# Patient Record
Sex: Female | Born: 1950 | Race: White | Hispanic: No
Health system: Southern US, Community
[De-identification: ages and names within clinical notes are randomized; demographics above are authoritative.]

## PROBLEM LIST (undated history)

## (undated) ENCOUNTER — Emergency Department: Discharge status: Absent without leave | Payer: Disability Insurance

---

## 2014-12-16 ENCOUNTER — Inpatient Hospital Stay: Payer: Self-pay | Admitting: Internal Medicine

## 2015-03-02 NOTE — Discharge Summary (Signed)
PATIENT NAMMauro Walters:  Andrea Walters, Andrea Walters MR#:  [id number] DATE OF BIRTH:  1951-03-07  DATE OF ADMISSION:  12/16/2014 DATE OF DISCHARGE:  12/19/2014  ADMITTING DIAGNOSES:  1.  Chronic obstructive pulmonary disease exacerbation.  2.  Diabetes mellitus.  3.  Hypertension.  4.  Anxiety.  5.  Hyperlipidemia.   DISCHARGE DIAGNOSES:  1.  Acute exacerbation of chronic obstructive pulmonary disease.  2.  Insulin-requiring diabetes mellitus.  3.  Hypertension.  4.  Anxiety.  5.  Hyperlipidemia.  6.  Nicotine abuse.   PROCEDURES: None.   CONSULTATIONS: None.  BRIEF HISTORY AND PHYSICAL AND HOSPITAL COURSE BASED ON THE PROBLEM: 1.  Acute exacerbation of COPD. The patient came into the ED with a chief complaint of shortness of breath and cough. Please review history and physical for details. The patient failed outpatient antibiotic therapy with Z-Pak. In the ED, the patient was started on IV steroids and around-the-clock nebulizer treatments. She was also started on Levaquin IV. She was given oxygen via nasal cannula, as she was short of breath initially. With IV steroids and antibiotics, her clinical situation significantly improved. Nebulizer treatments were continued and today the patient is feeling better, off the oxygen, saturating at 95% on room air without a need for oxygen in the future. The patient will be discharged home with p.o. ciprofloxacin for 3 more days and a tapering dose of steroids. The patient will be continued on Advair and albuterol. 2.  Diabetes mellitus, insulin-requiring. The patient was taking Novolin 70/30 at home, which was continued during the hospital course. As the patient was on IV steroids, the patient was hyperglycemic and Novolin 70/30 dose was increased to 30 units subcutaneously twice a day. The plan is to continue metformin. The patient was seen by inpatient glycemic nurse and they have recommended the patient to follow up with them as an outpatient.  3.  Nicotine dependence.  The patient was counseled to quit smoking and the patient is willing to continue nicotine patch as an outpatient.  4.  Hypertension. Blood pressure being stable, the plan is to continue her home medication: atenolol and lisinopril.  5.  Anxiety. Continue Paxil.   Overall condition is stable. Decision is made to discharge the patient home in stable condition, and she does not need any oxygen as her pulse oximetry is fine.   DISCHARGE PHYSICAL EXAMINATION: VITAL SIGNS: Today, temperature 97.5, pulse 83, respirations 18, blood pressure 125/72, pulse oximetry 96% on room air.  GENERAL APPEARANCE: Not in acute distress. Moderately built and nourished.  HEENT: Normocephalic, atraumatic. Pupils are equally reacting to light and accommodation. No scleral icterus. No conjunctival injection. No sinus tenderness. No postnasal drip. Moist mucous membranes.  NECK: Supple. No JVD. No thyromegaly. Range of motion is intact.  LUNGS: Clear to auscultation bilaterally. No accessory muscle use and no anterior chest wall tenderness on palpation.  CARDIAC: S1, S2 normal. Regular rate and rhythm. No murmur.  GASTROINTESTINAL: Soft. Bowel sounds are positive in all 4 quadrants. Nontender, nondistended. No hepatosplenomegaly. No masses.  NEUROLOGIC: Awake, alert, oriented x 3. Cranial nerves II through XII grossly intact. Motor and sensory are intact. Reflexes are 2+.  EXTREMITIES: No edema. No cyanosis. No clubbing.  SKIN: Warm to touch. Normal turgor. No rashes. No lesions.  MUSCULOSKELETAL: No joint effusion, tenderness, edema. PSYCHIATRIC: Normal mood and affect.  LABORATORY AND IMAGING STUDIES: Sputum culture: Colonies are too small to read. Blood cultures: No growth in the 48 hours. On February 15, troponin 0.05. CBC is normal.  BMP: Glucose 105, BUN 12, creatinine 0.57, anion gap is 6. The rest of the BMP is normal. EKG has revealed sinus tachycardia on February 15, but eventually her heart rate is well  controlled.   CODE STATUS: The patient is full code.   ACTIVITY: As tolerated.   DIET: Low-salt, carbohydrate-controlled diet.  MEDICATIONS AT THE TIME OF DISCHARGE: Flonase nasal, 1 spray nasally once daily; ProAir 2 puffs inhalations every 4 hours as needed for shortness of breath; paroxetine 40 mg p.o. once daily; melatonin 3 mg 1 tablet p.o. at bedtime; lisinopril 10 mg 1 tablet p.o. once daily; Aleve 220 mg 1 tablet p.o. every 8 hours as needed for pain; cetirizine 10 mg once daily; metformin 1000 mg 1 tablet p.o. 2 times a day; furosemide 20 mg 2 tablets p.o. once daily; atenolol 50 mg 1 tablet p.o. once daily; simvastatin 20 mg p.o. once daily at bedtime; Novolin 70/30, 30 units subcutaneously twice a day; nicotine 14 mg transdermal patch, to change once daily; fluticasone salmeterol 250/50, 1 puff inhalation 2 times a day. Insulin aspart per sliding scale subcutaneously 3 times a day per protocol: Below 150, no coverage; 151 to 200, provide 2 units; 201 to 250, 4 units; 251 to 300, 6 units; 301 to 350, 8 units; and 351 to 400, 10 units. Prednisone tapering dose 50 mg once a day for 1 day and then 40 mg for a day and then 30 mg for a day and then 20 mg for 1 day, and then 10 mg for 1 day, and then stop. Ciprofloxacin 250 mg 1 tablet p.o. q. 12 hours for 3 days.  HOME OXYGEN: Does not need any home oxygen.   ACTIVITY: As tolerated.  FOLLOWUP: Followup appointment with primary care physician in a week and outpatient diabetes clinic in 1 to 2 weeks.  The diagnosis and plan of care were discussed in detail with the patient. She verbalized understanding of the plan.  TOTAL TIME SPENT: 45 minutes.   ____________________________ Ramonita Lab, MD ag:ST D: 12/19/2014 12:45:02 ET T: 12/19/2014 15:23:06 ET JOB#: [id number]  cc: Ramonita Lab, MD, <Dictator> Deloris Ping, NP Ramonita Lab MD ELECTRONICALLY SIGNED 12/27/2014 15:06

## 2015-03-02 NOTE — H&P (Signed)
PATIENT NAMMauro Walters:  Andrea Walters, Andrea Walters MR#:  [id number] DATE OF BIRTH:  02-20-1951  DATE OF ADMISSION:  12/16/2014  PRIMARY CARE PHYSICIAN:  Dr. Deloris PingLeslie Sharpe.   CHIEF COMPLAINT: Shortness of breath and cough.   HISTORY OF PRESENT ILLNESS: This is a 64 year old female who presented to the hospital with progressive worsening shortness of breath and cough over the past 2 weeks. The patient says that her cough has now been productive with some yellow sputum, she admits to some chills, but no documented fever, admits to worsening exertional dyspnea. Admits to mild chest tightness, but no acute chest pain. Positive nausea, but no vomiting, no abdominal pain, no diarrhea. No other associated symptoms. The patient saw her primary care physician, was started on a Z-Pak and albuterol inhaler for her COPD, but despite being on that her symptoms have not improved. She therefore came to the ER for further evaluation. The patient received some DuoNebs here in the Emergency Room, also some steroids, but continues to have significant wheezing and shortness of breath and therefore hospitalist services were contacted for further treatment and evaluation.   REVIEW OF SYSTEMS:    CONSTITUTIONAL: No documented fever. No weight gain or weight loss.  EYES: No blurry or double vision.  EARS, NOSE, AND THROAT: No tinnitus. No postnasal drip. No redness of the oropharynx.  RESPIRATORY: Positive cough. Positive wheeze. No hemoptysis. Positive dyspnea on exertion. Positive COPD.   CARDIOVASCULAR: No chest pain. No orthopnea. No palpitations. No syncope.  GASTROINTESTINAL: No nausea. No vomiting, diarrhea, abdominal pain. No melena or hematochezia.  GENITOURINARY: No dysuria or hematuria.  ENDOCRINE:  No polyuria, nocturia, heat or cold intolerance.  HEMATOLOGIC: No anemia. No bruising or bleeding.  INTEGUMENTARY: No rashes. No lesions.  MUSCULOSKELETAL: No arthritis. No swelling. No gout.  NEUROLOGIC: No numbness or tingling. No  ataxia. No seizure-type activity.  PSYCHIATRIC: Positive anxiety. No insomnia. No ADD.   PAST MEDICAL HISTORY: Consistent with diabetes, hypertension, COPD, anxiety, hyperlipidemia.   ALLERGIES: PENICILLIN, WHICH CAUSES ANAPHYLAXIS.   SOCIAL HISTORY: Still smokes about a half a pack per day, has been smoking for the past 40 + years. No alcohol abuse. No illicit drug abuse. Lives at home with her son.   FAMILY HISTORY: Mother and father are both deceased, both died from complications of an MI.  Mother was also diabetic.   CURRENT MEDICATIONS: Aleve 220 mg 1 tab q. 8 hours as needed, atenolol 50 mg daily, cetirizine 10 mg daily, Flonase 1 spray to each nostril daily, azithromycin 250 mg daily x 3 days, Lasix 40 mg daily, lisinopril 10 mg daily, melatonin 3 mg at bedtime, metformin 1000 mg b.i.d., Novolin 70/30, 24 units b.i.d., Paxil 40 mg daily, albuterol inhaler 2 puffs every 4 hours as needed, simvastatin 20 mg daily.   PHYSICAL EXAMINATION:  VITAL SIGNS: Temperature is 97.9, pulse 114, respirations 24, blood pressure 134/84, saturation is 98% on 2 liters nasal cannula.  GENERAL: She is a pleasant-appearing female in mild respiratory distress.  HEAD, EYES, EARS, NOSE, AND THROAT: Atraumatic, normocephalic. Extraocular muscles are intact. Pupils reactive to light. Sclerae anicteric. No conjunctival injection. No pharyngeal erythema.  NECK: Supple. There is no jugular venous distention. No bruits, no lymphadenopathy, no thyromegaly.  HEART: Regular rate and rhythm, tachycardic. No murmurs, no rubs, no clicks.  LUNGS: She has coarse rhonchi and wheezing diffusely. Positive use of accessory muscles. No dullness to percussion.  ABDOMEN: Soft, flat, nontender, nondistended. Has good bowel sounds. No hepatosplenomegaly appreciated.  EXTREMITIES: No evidence  of any cyanosis, clubbing, or peripheral edema. Has + 2 pedal and radial pulses bilaterally.  NEUROLOGICAL: The patient is alert, awake, and  oriented x 3 with no focal motor or sensory deficits appreciated bilaterally.  SKIN: Moist and warm with no rashes appreciated.  LYMPHATIC: There is no cervical or axillary lymphadenopathy.   LABORATORY DATA: Serum glucose of 105, BUN 12, creatinine 0.5, sodium 142, potassium 3.7, chloride 104, bicarbonate 32.  Troponin 0.05. White cell count 7.6, hemoglobin 12.6, hematocrit 39.1, platelet count 223,000.   The patient did have a chest x-ray done which showed findings consistent with a degree of CHF, no airspace consolidation.   ASSESSMENT AND PLAN: This is a 64 year old female with a history of chronic obstructive pulmonary disease, hypertension, diabetes, anxiety, hyperlipidemia, who presents to the hospital due to worsening shortness of breath and cough over the past 2 weeks, noted to be in chronic obstructive pulmonary disease exacerbation.   1.  Chronic obstructive pulmonary disease exacerbation. This is likely the cause of the patient's worsening shortness of breath and cough. The patient has failed outpatient therapy with Z-Pak and albuterol inhaler. She will be admitted to hospital, started on IV steroids, around-the-clock nebulizer treatments, will also start her on some Advair. Her chest x-ray is negative for pneumonia, although I will empirically start her on Levaquin. Follow sputum cultures. She needs to be assessed for home oxygen prior to discharge.  2.  Diabetes. Continue with her Novolin 70/30 and metformin. Her blood sugars may run high due to IV steroids, therefore I will also place her on sliding scale insulin coverage.  3.  Hypertension, presently hemodynamically stable. Continue atenolol and lisinopril.  4.  Anxiety. Continue Paxil.  5.  Hyperlipidemia. Continue simvastatin.   CODE STATUS: The patient is a full code.   TIME SPENT: 50 minutes.    ____________________________ Rolly Pancake. Cherlynn Kaiser, MD vjs:bu D: 12/16/2014 12:54:17 ET T: 12/16/2014 13:13:18  ET JOB#: [id number]  cc: Rolly Pancake. Cherlynn Kaiser, MD, <Dictator> Houston Siren MD ELECTRONICALLY SIGNED 01/01/2015 15:30

## 2015-03-05 ENCOUNTER — Other Ambulatory Visit: Payer: Self-pay | Admitting: Family Medicine

## 2015-03-17 ENCOUNTER — Other Ambulatory Visit: Payer: Self-pay | Admitting: Family Medicine

## 2015-03-17 ENCOUNTER — Ambulatory Visit: Payer: Self-pay

## 2015-03-17 DIAGNOSIS — R52 Pain, unspecified: Secondary | ICD-10-CM

## 2015-03-18 ENCOUNTER — Ambulatory Visit: Payer: Self-pay

## 2015-03-19 ENCOUNTER — Other Ambulatory Visit: Payer: Self-pay | Admitting: Family Medicine

## 2015-03-19 DIAGNOSIS — R52 Pain, unspecified: Secondary | ICD-10-CM

## 2015-04-02 ENCOUNTER — Ambulatory Visit: Payer: Self-pay

## 2015-04-03 ENCOUNTER — Ambulatory Visit: Payer: Disability Insurance | Attending: Internal Medicine

## 2015-04-03 DIAGNOSIS — J449 Chronic obstructive pulmonary disease, unspecified: Secondary | ICD-10-CM | POA: Diagnosis not present

## 2015-10-02 IMAGING — CR DG CHEST 2V
1 series · 2 of 2 positions shown · non-contrast
Comparison: August 05, 2014

CLINICAL DATA: Two week history of cough. Progressive shortness of
breath past week

EXAM:
CHEST  2 VIEW

[Series 1: dxr chest pa (or ap) and lateral · 0.14mm/px · 2 of 2 slices shown]
[im 1/2]
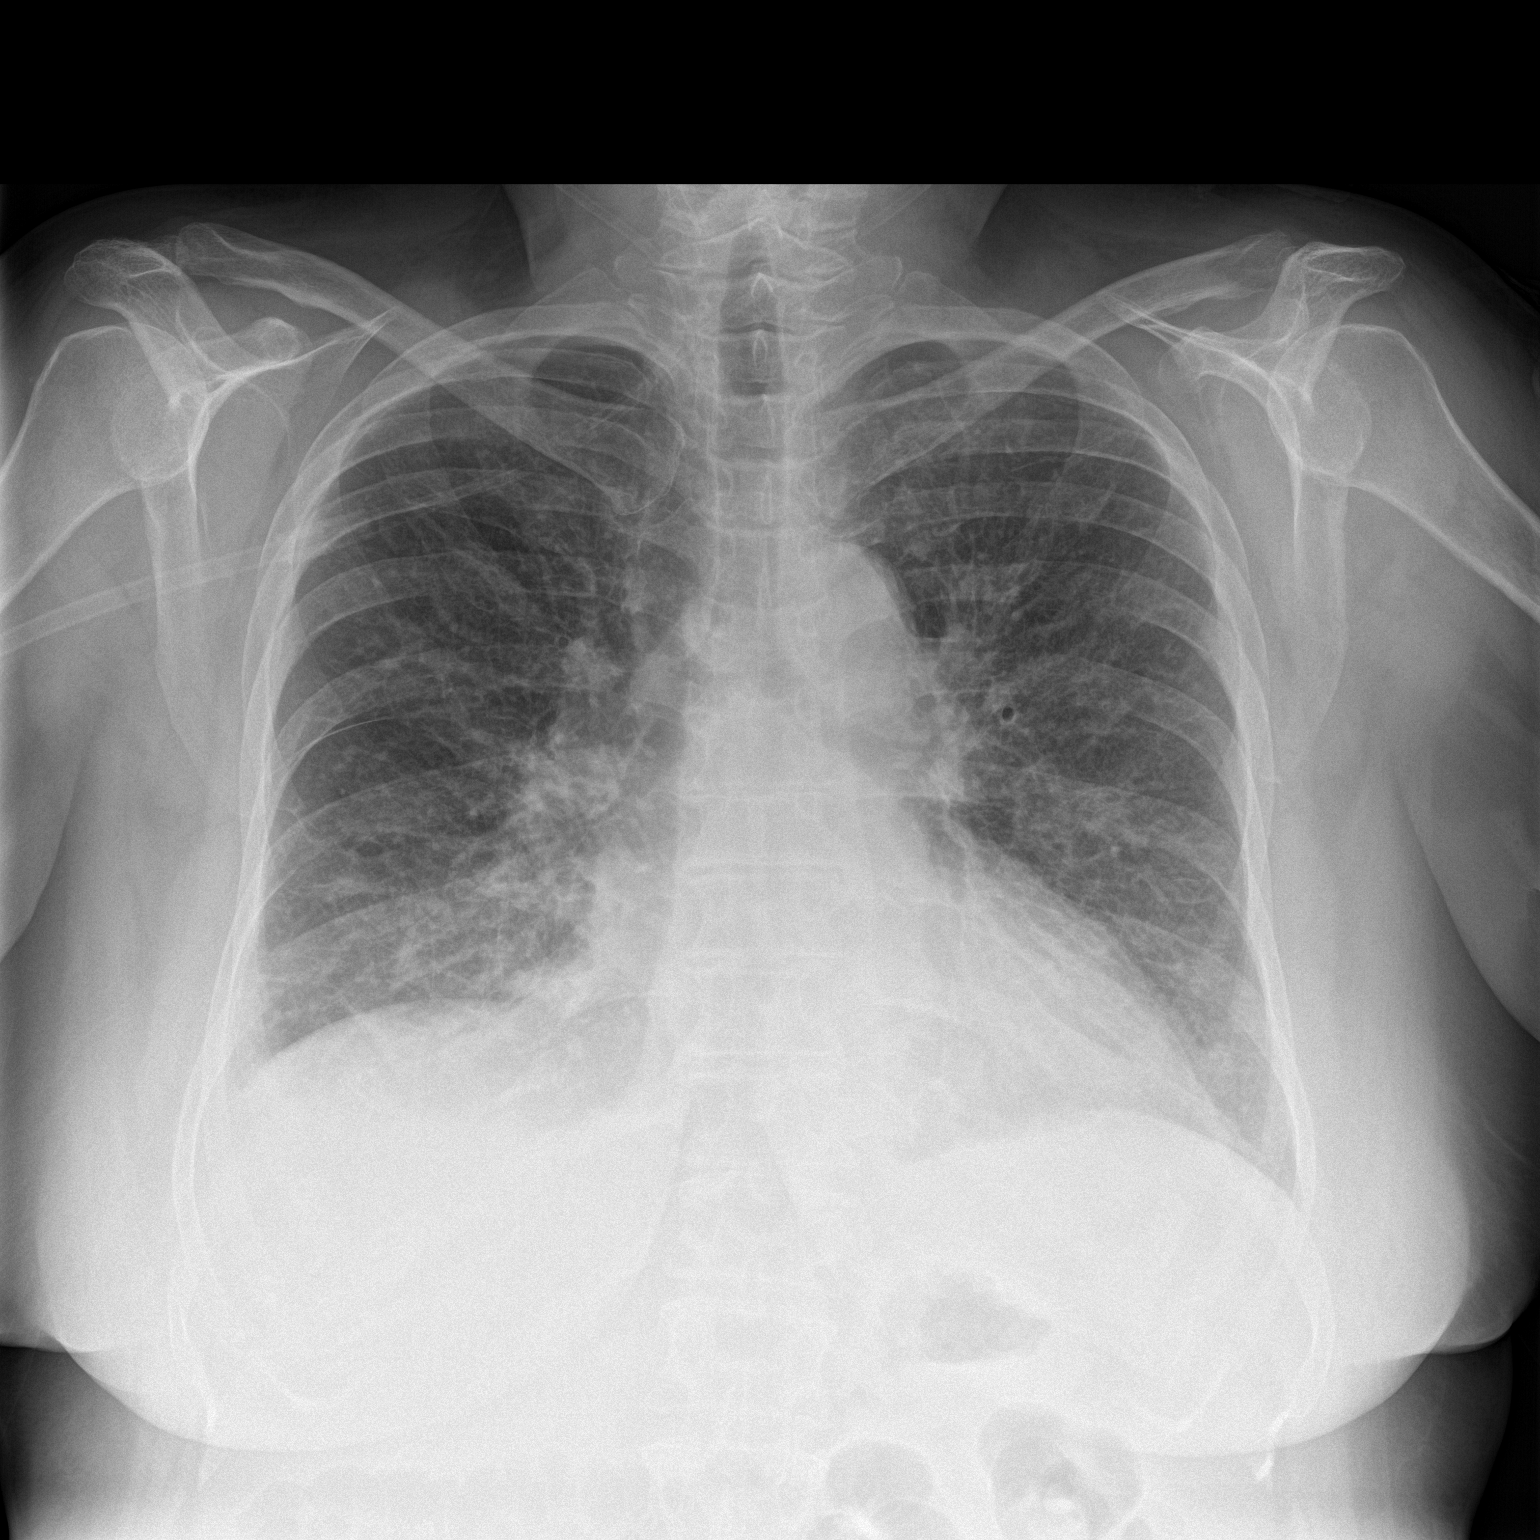
[im 2/2]
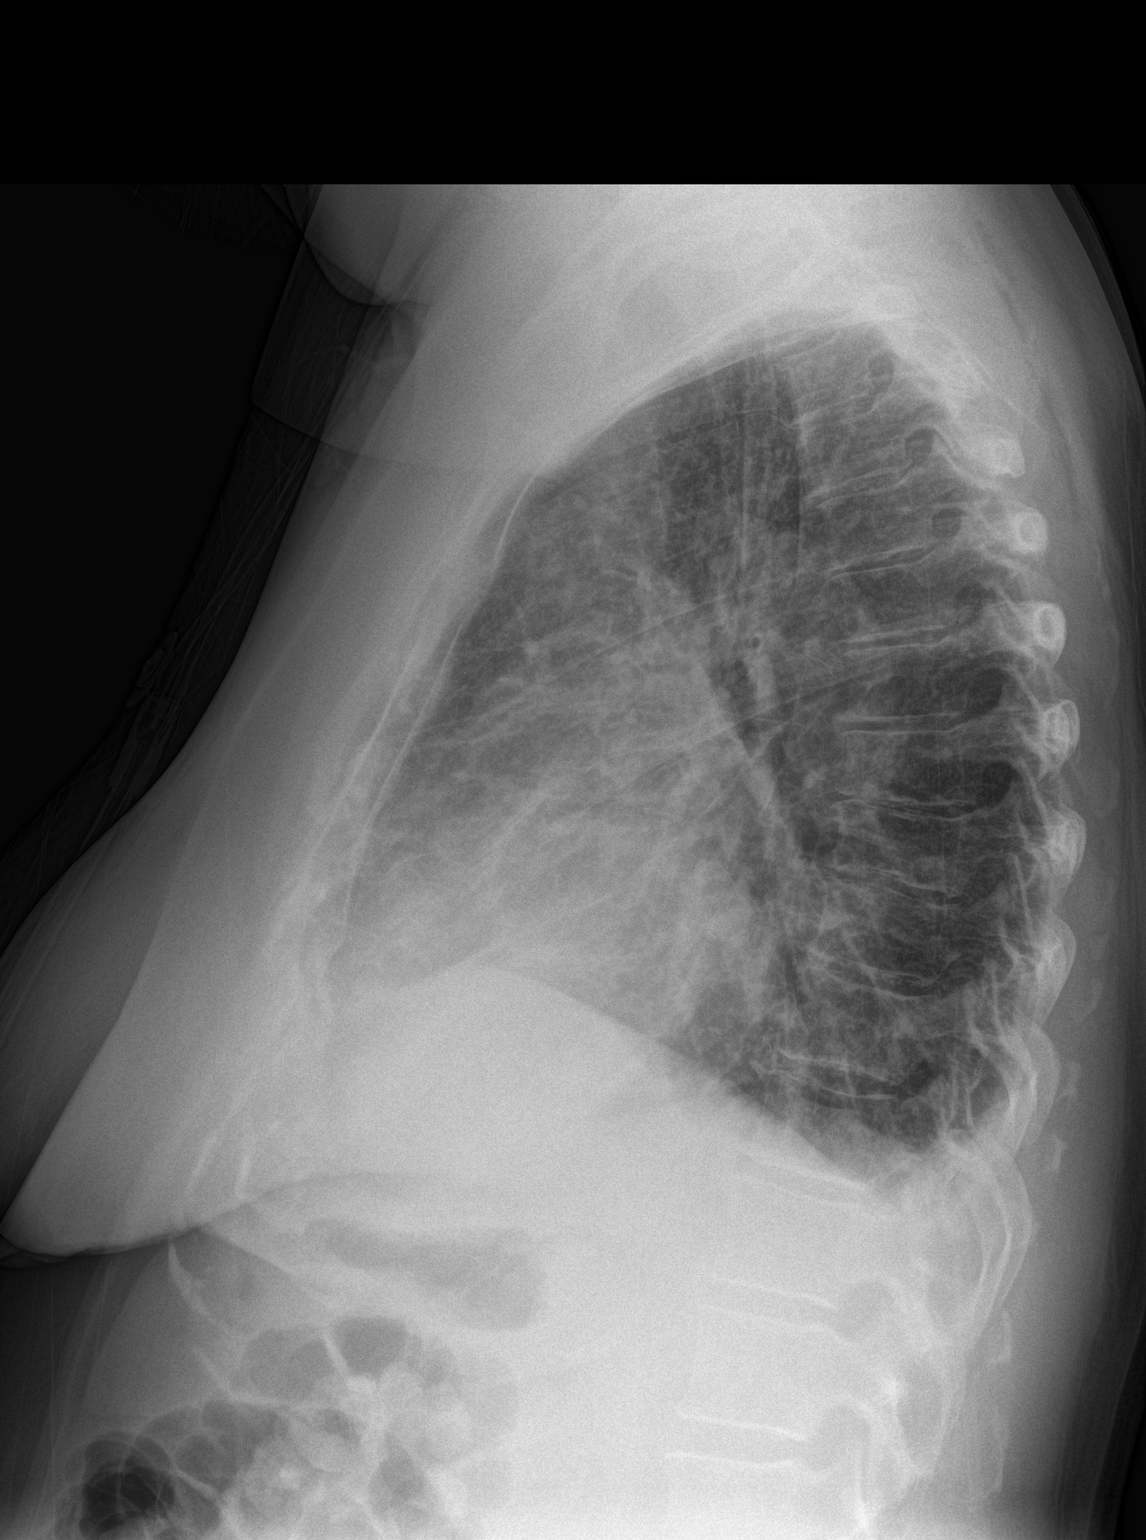

[2 of 2 positions shown; findings below may reference images not displayed]

FINDINGS: There is mild generalized interstitial edema with minimal right
effusion. Heart is mildly enlarged. Pulmonary vascularity is within
normal limits. There is no airspace consolidation. No adenopathy. No
bone lesions.
IMPRESSION: Findings consistent with a degree of congestive heart failure. No
airspace consolidation.
